# Patient Record
Sex: Female | Born: 1970
Health system: Southern US, Community
[De-identification: ages and names within clinical notes are randomized; demographics above are authoritative.]

---

## 1999-09-24 ENCOUNTER — Ambulatory Visit (HOSPITAL_COMMUNITY): Admission: RE | Admit: 1999-09-24 | Discharge: 1999-09-24 | Payer: Self-pay | Admitting: Internal Medicine

## 1999-10-22 ENCOUNTER — Encounter: Payer: Self-pay | Admitting: Internal Medicine

## 1999-10-22 ENCOUNTER — Encounter: Admission: RE | Admit: 1999-10-22 | Discharge: 1999-10-22 | Payer: Self-pay | Admitting: Internal Medicine

## 2000-05-11 ENCOUNTER — Other Ambulatory Visit: Admission: RE | Admit: 2000-05-11 | Discharge: 2000-05-11 | Payer: Self-pay | Admitting: Obstetrics and Gynecology

## 2001-01-31 ENCOUNTER — Observation Stay (HOSPITAL_COMMUNITY): Admission: AD | Admit: 2001-01-31 | Discharge: 2001-02-01 | Payer: Self-pay | Admitting: Obstetrics and Gynecology

## 2001-01-31 ENCOUNTER — Encounter: Payer: Self-pay | Admitting: Obstetrics and Gynecology

## 2001-04-05 ENCOUNTER — Inpatient Hospital Stay (HOSPITAL_COMMUNITY): Admission: AD | Admit: 2001-04-05 | Discharge: 2001-04-05 | Payer: Self-pay | Admitting: Obstetrics and Gynecology

## 2001-04-26 ENCOUNTER — Inpatient Hospital Stay (HOSPITAL_COMMUNITY): Admission: AD | Admit: 2001-04-26 | Discharge: 2001-04-28 | Payer: Self-pay | Admitting: Obstetrics and Gynecology

## 2001-06-06 ENCOUNTER — Other Ambulatory Visit: Admission: RE | Admit: 2001-06-06 | Discharge: 2001-06-06 | Payer: Self-pay | Admitting: Obstetrics and Gynecology

## 2002-06-06 ENCOUNTER — Other Ambulatory Visit: Admission: RE | Admit: 2002-06-06 | Discharge: 2002-06-06 | Payer: Self-pay | Admitting: Obstetrics and Gynecology

## 2003-07-09 ENCOUNTER — Other Ambulatory Visit: Admission: RE | Admit: 2003-07-09 | Discharge: 2003-07-09 | Payer: Self-pay | Admitting: Obstetrics and Gynecology

## 2004-09-14 ENCOUNTER — Other Ambulatory Visit: Admission: RE | Admit: 2004-09-14 | Discharge: 2004-09-14 | Payer: Self-pay | Admitting: Obstetrics and Gynecology

## 2005-05-24 ENCOUNTER — Inpatient Hospital Stay (HOSPITAL_COMMUNITY): Admission: AD | Admit: 2005-05-24 | Discharge: 2005-05-26 | Payer: Self-pay | Admitting: Obstetrics and Gynecology

## 2005-06-20 ENCOUNTER — Other Ambulatory Visit: Admission: RE | Admit: 2005-06-20 | Discharge: 2005-06-20 | Payer: Self-pay | Admitting: Obstetrics and Gynecology

## 2005-06-23 ENCOUNTER — Encounter: Admission: RE | Admit: 2005-06-23 | Discharge: 2005-06-23 | Payer: Self-pay | Admitting: Obstetrics and Gynecology

## 2005-06-23 HISTORY — PX: BREAST BIOPSY: SHX20

## 2012-08-27 ENCOUNTER — Ambulatory Visit (INDEPENDENT_AMBULATORY_CARE_PROVIDER_SITE_OTHER): Payer: BC Managed Care – PPO | Admitting: Emergency Medicine

## 2012-08-27 VITALS — BP 126/74 | HR 65 | Temp 98.5°F | Resp 17 | Ht 64.0 in | Wt 115.0 lb

## 2012-08-27 DIAGNOSIS — J349 Unspecified disorder of nose and nasal sinuses: Secondary | ICD-10-CM

## 2012-08-27 DIAGNOSIS — J329 Chronic sinusitis, unspecified: Secondary | ICD-10-CM

## 2012-08-27 DIAGNOSIS — J309 Allergic rhinitis, unspecified: Secondary | ICD-10-CM

## 2012-08-27 DIAGNOSIS — R51 Headache: Secondary | ICD-10-CM

## 2012-08-27 DIAGNOSIS — J00 Acute nasopharyngitis [common cold]: Secondary | ICD-10-CM

## 2012-08-27 DIAGNOSIS — R05 Cough: Secondary | ICD-10-CM

## 2012-08-27 DIAGNOSIS — R059 Cough, unspecified: Secondary | ICD-10-CM

## 2012-08-27 DIAGNOSIS — R519 Headache, unspecified: Secondary | ICD-10-CM

## 2012-08-27 LAB — POCT RAPID STREP A (OFFICE): Rapid Strep A Screen: NEGATIVE

## 2012-08-27 MED ORDER — FLUTICASONE PROPIONATE 50 MCG/ACT NA SUSP: 2.0000 | Freq: Every day | NASAL | Status: DC

## 2012-08-27 MED ORDER — AMOXICILLIN 875 MG PO TABS: 875.0000 mg | ORAL_TABLET | Freq: Two times a day (BID) | ORAL | Status: DC

## 2012-08-27 MED ORDER — BENZONATATE 100 MG PO CAPS: 100.0000 mg | ORAL_CAPSULE | Freq: Three times a day (TID) | ORAL | Status: DC | PRN

## 2012-08-27 NOTE — Progress Notes (Signed)
  Subjective:    Patient ID: Michele Edwards, female    DOB: 1971/03/07, 41 y.o.   MRN: [id number]  Sinusitis There has been no fever. Associated symptoms include congestion, coughing, headaches, sinus pressure and a sore throat.  Headache  Associated symptoms include coughing, sinus pressure and a sore throat.  41 year old female comes into day complaining of a sore throat and headache for 2 days hasn'tnt been around anyone sick Has had flu shot this year A little facial pain when bending over Some cough more at night HX of sinus infections Yellowish green mucus take zyrtec for sinus    Review of Systems  HENT: Positive for congestion, sore throat and sinus pressure.   Respiratory: Positive for cough.   Neurological: Positive for headaches.       Objective:   Physical Exam HEENT exam reveals significant nasal congestion. There is fluid behind the left TM. She's had a tonsillectomy there is mild redness of the posterior fat her neck was supple chest was clear to both auscultation and percussion  Results for orders placed in visit on 08/27/12  POCT RAPID STREP A (OFFICE)      Component Value Range   Rapid Strep A Screen Negative  Negative        Assessment & Plan:  History and physical are most consistent with a sinusitis. We'll treat with Flonase and amoxicillin gave her patient information regarding sinusitis

## 2012-08-27 NOTE — Patient Instructions (Addendum)

## 2012-08-28 ENCOUNTER — Other Ambulatory Visit: Payer: Self-pay | Admitting: Dermatology

## 2012-09-20 ENCOUNTER — Other Ambulatory Visit: Payer: Self-pay | Admitting: Dermatology

## 2013-09-04 ENCOUNTER — Other Ambulatory Visit: Payer: Self-pay | Admitting: Dermatology

## 2013-11-19 ENCOUNTER — Other Ambulatory Visit: Payer: Self-pay | Admitting: Obstetrics and Gynecology

## 2013-12-31 ENCOUNTER — Ambulatory Visit (INDEPENDENT_AMBULATORY_CARE_PROVIDER_SITE_OTHER): Payer: BC Managed Care – PPO | Admitting: Podiatry

## 2013-12-31 ENCOUNTER — Encounter: Payer: Self-pay | Admitting: Podiatry

## 2013-12-31 VITALS — BP 112/66 | HR 57 | Resp 12

## 2013-12-31 DIAGNOSIS — L03039 Cellulitis of unspecified toe: Secondary | ICD-10-CM

## 2013-12-31 DIAGNOSIS — M79609 Pain in unspecified limb: Secondary | ICD-10-CM

## 2013-12-31 NOTE — Progress Notes (Signed)
   Subjective:    Patient ID: Michele Edwards, female    DOB: 02/26/1971, 43 y.o.   MRN: [id number]  HPI  PT STATED LT FOOT GREAT TOENAIL IS BEEN HURTING FOR 1 WEEK. THE TOENAIL IS GETTING BETTER. THE TOE GET AGGRAVATED BY PRESSURE BUT TRIED NO TREATMENT.   Review of Systems  All other systems reviewed and are negative.       Objective:   Physical Exam: Pulses are palpable bilateral. She has mild erythema to the fibular border of the hallux left. Purple pain collar to the nails. No reproducible pain on palpation of the toe. I evaluated the fibular border of the toenail were she had trimmed the majority of the nail away from the margin. I was able to easily see the margin of the nail does not appear to be clinically infected at this point.        Assessment & Plan:  Assessment: Mild paronychia hallux left.  Plan: Followup with me on an as-needed basis. Remember to ask her our her trip to [place] was and how was the diving.

## 2014-03-20 ENCOUNTER — Other Ambulatory Visit: Payer: Self-pay | Admitting: Dermatology

## 2014-05-22 ENCOUNTER — Other Ambulatory Visit: Payer: Self-pay | Admitting: Obstetrics and Gynecology

## 2014-05-23 LAB — CYTOLOGY - PAP

## 2014-11-27 ENCOUNTER — Other Ambulatory Visit: Payer: Self-pay | Admitting: Obstetrics and Gynecology

## 2014-12-01 LAB — CYTOLOGY - PAP

## 2015-07-02 ENCOUNTER — Ambulatory Visit (INDEPENDENT_AMBULATORY_CARE_PROVIDER_SITE_OTHER): Payer: BLUE CROSS/BLUE SHIELD | Admitting: Physician Assistant

## 2015-07-02 VITALS — BP 108/60 | HR 62 | Temp 98.8°F | Resp 16 | Ht 64.0 in | Wt 126.0 lb

## 2015-07-02 DIAGNOSIS — R070 Pain in throat: Secondary | ICD-10-CM | POA: Diagnosis not present

## 2015-07-02 DIAGNOSIS — J029 Acute pharyngitis, unspecified: Secondary | ICD-10-CM | POA: Diagnosis not present

## 2015-07-02 LAB — POCT RAPID STREP A (OFFICE): RAPID STREP A SCREEN: NEGATIVE

## 2015-07-02 MED ORDER — MAGIC MOUTHWASH W/LIDOCAINE: 5.0000 mL | ORAL | Status: AC | PRN

## 2015-07-02 NOTE — Progress Notes (Signed)
[facility] [address] [place] [address] [phone]  Date:  07/02/2015   Name:  Michele Edwards   DOB:  [birth year]   MRN:  [id number]  PCP:  Willow Ora, MD    History of Present Illness:  Michele Edwards is a 44 y.o. female patient who presents to [facility] for chief complaint of throat pain for 5 days.  Day after considerable throat pain, she was seen at [facility], who did a rapid strep.  She was given tylenol.  Since then, the throat pain has stayed the same.  Within the last 2 days, developed mild nasal congestion and ear discomfort.  She had a tonsillectomy years ago.     There are no active problems to display for this patient.   History reviewed. No pertinent past medical history.  History reviewed. No pertinent past surgical history.  Social History  Substance Use Topics  . Smoking status: Never Smoker   . Smokeless tobacco: None  . Alcohol Use: 0.6 oz/week    1 Glasses of wine per week    History reviewed. No pertinent family history.  No Known Allergies  Medication list has been reviewed and updated.  Current Outpatient Prescriptions on File Prior to Visit  Medication Sig Dispense Refill  . cetirizine (ZYRTEC) 10 MG tablet Take 10 mg by mouth daily.    Marland Kitchen spironolactone (ALDACTONE) 50 MG tablet Take 50 mg by mouth daily.    . Multiple Vitamins-Minerals (MULTIVITAMIN WITH MINERALS) tablet Take 1 tablet by mouth daily.     No current facility-administered medications on file prior to visit.    ROS ROS otherwise unremarkable unless listed above.   Physical Examination: BP 108/60 mmHg  Pulse 62  Temp(Src) 98.8 F (37.1 C) (Oral)  Resp 16  Ht  (1.626 m)  Wt 126 lb (57.153 kg)  BMI 21.62 kg/m2  SpO2 99%  LMP 05/30/2015 Ideal Body Weight: Weight in (lb) to have BMI = 25: 145.3  Physical Exam  Constitutional: She is oriented to person, place, and time. She appears well-developed and well-nourished. No  distress.  HENT:  Head: Normocephalic and atraumatic.  Right Ear: External ear normal.  Left Ear: External ear normal.  Nose: Right sinus exhibits no maxillary sinus tenderness and no frontal sinus tenderness. Left sinus exhibits no maxillary sinus tenderness and no frontal sinus tenderness.  Mouth/Throat: Posterior oropharyngeal erythema (Beefy red at oropharynx.  There is no mucus present, revealing signs of post-nasal drip .  ) present. No oropharyngeal exudate or posterior oropharyngeal edema.  Eyes: Conjunctivae and EOM are normal. Pupils are equal, round, and reactive to light.  Cardiovascular: Normal rate.   Pulmonary/Chest: Effort normal. No respiratory distress.  Neurological: She is alert and oriented to person, place, and time.  Skin: She is not diaphoretic.  Psychiatric: She has a normal mood and affect. Her behavior is normal.     Assessment and Plan: 44 year old female is here today for chief complaint of pharyngitis.  This present negative.  I am placing a strep culture at this time.   I have advised her to use tylenol and cepacol otc.   Magic mouthwash given as well. Will contact for results.  If positive, will treat with amoxicillin  bid 10 days.   Acute pharyngitis, unspecified pharyngitis type - Plan: magic mouthwash w/lidocaine SOLN  Throat pain in adult - Plan: POCT rapid strep A, Culture, Group A Strep, magic mouthwash w/lidocaine SOLN  Trena Platt, PA-C [facility] 07/02/2015 9:25 AM

## 2015-07-02 NOTE — Patient Instructions (Signed)
At this time this, appears to be viral.  I am placing a strep culture at this time.  It should come back within the next 48-72 hours.  I will contact you with otherwise.   Please use tylenol.  Gargle with saline solution.     Pharyngitis Pharyngitis is redness, pain, and swelling (inflammation) of your pharynx.  CAUSES  Pharyngitis is usually caused by infection. Most of the time, these infections are from viruses (viral) and are part of a cold. However, sometimes pharyngitis is caused by bacteria (bacterial). Pharyngitis can also be caused by allergies. Viral pharyngitis may be spread from person to person by coughing, sneezing, and personal items or utensils (cups, forks, spoons, toothbrushes). Bacterial pharyngitis may be spread from person to person by more intimate contact, such as kissing.  SIGNS AND SYMPTOMS  Symptoms of pharyngitis include:   Sore throat.   Tiredness (fatigue).   Low-grade fever.   Headache.  Joint pain and muscle aches.  Skin rashes.  Swollen lymph nodes.  Plaque-like film on throat or tonsils (often seen with bacterial pharyngitis). DIAGNOSIS  Your health care provider will ask you questions about your illness and your symptoms. Your medical history, along with a physical exam, is often all that is needed to diagnose pharyngitis. Sometimes, a rapid strep test is done. Other lab tests may also be done, depending on the suspected cause.  TREATMENT  Viral pharyngitis will usually get better in 3-4 days without the use of medicine. Bacterial pharyngitis is treated with medicines that kill germs (antibiotics).  HOME CARE INSTRUCTIONS   Drink enough water and fluids to keep your urine clear or pale yellow.   Only take over-the-counter or prescription medicines as directed by your health care provider:   If you are prescribed antibiotics, make sure you finish them even if you start to feel better.   Do not take aspirin.   Get lots of rest.    Gargle with 8 oz of salt water ( tsp of salt per 1 qt of water) as often as every 1-2 hours to soothe your throat.   Throat lozenges (if you are not at risk for choking) or sprays may be used to soothe your throat. SEEK MEDICAL CARE IF:   You have large, tender lumps in your neck.  You have a rash.  You cough up green, yellow-brown, or bloody spit. SEEK IMMEDIATE MEDICAL CARE IF:   Your neck becomes stiff.  You drool or are unable to swallow liquids.  You vomit or are unable to keep medicines or liquids down.  You have severe pain that does not go away with the use of recommended medicines.  You have trouble breathing (not caused by a stuffy nose). MAKE SURE YOU:   Understand these instructions.  Will watch your condition.  Will get help right away if you are not doing well or get worse. Document Released: 09/19/2005 Document Revised: 07/10/2013 Document Reviewed: 05/27/2013 [facility] Patient Information 2015 [place], [place]. This information is not intended to replace advice given to you by your health care provider. Make sure you discuss any questions you have with your health care provider.

## 2015-07-03 LAB — CULTURE, GROUP A STREP: ORGANISM ID, BACTERIA: NORMAL

## 2015-07-06 ENCOUNTER — Telehealth: Payer: Self-pay

## 2015-07-06 NOTE — Telephone Encounter (Signed)
ERROR

## 2016-10-13 DIAGNOSIS — M79672 Pain in left foot: Secondary | ICD-10-CM | POA: Diagnosis not present

## 2016-11-01 DIAGNOSIS — C44319 Basal cell carcinoma of skin of other parts of face: Secondary | ICD-10-CM | POA: Diagnosis not present

## 2016-11-01 DIAGNOSIS — D2271 Melanocytic nevi of right lower limb, including hip: Secondary | ICD-10-CM | POA: Diagnosis not present

## 2016-11-01 DIAGNOSIS — D2262 Melanocytic nevi of left upper limb, including shoulder: Secondary | ICD-10-CM | POA: Diagnosis not present

## 2016-11-01 DIAGNOSIS — D225 Melanocytic nevi of trunk: Secondary | ICD-10-CM | POA: Diagnosis not present

## 2016-11-01 DIAGNOSIS — D2261 Melanocytic nevi of right upper limb, including shoulder: Secondary | ICD-10-CM | POA: Diagnosis not present

## 2016-11-01 DIAGNOSIS — D485 Neoplasm of uncertain behavior of skin: Secondary | ICD-10-CM | POA: Diagnosis not present

## 2016-12-14 DIAGNOSIS — Z0000 Encounter for general adult medical examination without abnormal findings: Secondary | ICD-10-CM | POA: Diagnosis not present

## 2016-12-15 DIAGNOSIS — C44319 Basal cell carcinoma of skin of other parts of face: Secondary | ICD-10-CM | POA: Diagnosis not present

## 2016-12-16 ENCOUNTER — Other Ambulatory Visit: Payer: Self-pay | Admitting: Family Medicine

## 2016-12-16 DIAGNOSIS — Z7689 Persons encountering health services in other specified circumstances: Secondary | ICD-10-CM | POA: Diagnosis not present

## 2016-12-16 DIAGNOSIS — Z0000 Encounter for general adult medical examination without abnormal findings: Secondary | ICD-10-CM | POA: Diagnosis not present

## 2016-12-16 DIAGNOSIS — Z01419 Encounter for gynecological examination (general) (routine) without abnormal findings: Secondary | ICD-10-CM | POA: Diagnosis not present

## 2016-12-16 DIAGNOSIS — Z1231 Encounter for screening mammogram for malignant neoplasm of breast: Secondary | ICD-10-CM

## 2016-12-19 DIAGNOSIS — H17823 Peripheral opacity of cornea, bilateral: Secondary | ICD-10-CM | POA: Diagnosis not present

## 2016-12-19 DIAGNOSIS — H04123 Dry eye syndrome of bilateral lacrimal glands: Secondary | ICD-10-CM | POA: Diagnosis not present

## 2017-01-11 ENCOUNTER — Ambulatory Visit
Admission: RE | Admit: 2017-01-11 | Discharge: 2017-01-11 | Disposition: A | Discharge status: Home / Self Care | Payer: BLUE CROSS/BLUE SHIELD | Source: Ambulatory Visit | Attending: Family Medicine | Admitting: Family Medicine

## 2017-01-11 DIAGNOSIS — Z1231 Encounter for screening mammogram for malignant neoplasm of breast: Secondary | ICD-10-CM

## 2017-10-11 DIAGNOSIS — Z23 Encounter for immunization: Secondary | ICD-10-CM | POA: Diagnosis not present

## 2017-10-11 DIAGNOSIS — G4709 Other insomnia: Secondary | ICD-10-CM | POA: Diagnosis not present

## 2017-10-11 DIAGNOSIS — Z6823 Body mass index (BMI) 23.0-23.9, adult: Secondary | ICD-10-CM | POA: Diagnosis not present

## 2017-10-12 DIAGNOSIS — H6981 Other specified disorders of Eustachian tube, right ear: Secondary | ICD-10-CM | POA: Diagnosis not present

## 2017-10-31 DIAGNOSIS — Z79899 Other long term (current) drug therapy: Secondary | ICD-10-CM | POA: Diagnosis not present

## 2017-10-31 DIAGNOSIS — L700 Acne vulgaris: Secondary | ICD-10-CM | POA: Diagnosis not present

## 2017-10-31 DIAGNOSIS — D2261 Melanocytic nevi of right upper limb, including shoulder: Secondary | ICD-10-CM | POA: Diagnosis not present

## 2017-10-31 DIAGNOSIS — D225 Melanocytic nevi of trunk: Secondary | ICD-10-CM | POA: Diagnosis not present

## 2017-12-01 DIAGNOSIS — M79671 Pain in right foot: Secondary | ICD-10-CM | POA: Diagnosis not present

## 2017-12-20 ENCOUNTER — Other Ambulatory Visit: Payer: Self-pay | Admitting: Family Medicine

## 2017-12-20 DIAGNOSIS — Z114 Encounter for screening for human immunodeficiency virus [HIV]: Secondary | ICD-10-CM | POA: Diagnosis not present

## 2017-12-20 DIAGNOSIS — Z0000 Encounter for general adult medical examination without abnormal findings: Secondary | ICD-10-CM | POA: Diagnosis not present

## 2017-12-20 DIAGNOSIS — Z13220 Encounter for screening for lipoid disorders: Secondary | ICD-10-CM | POA: Diagnosis not present

## 2017-12-20 DIAGNOSIS — Z1329 Encounter for screening for other suspected endocrine disorder: Secondary | ICD-10-CM | POA: Diagnosis not present

## 2017-12-20 DIAGNOSIS — Z1231 Encounter for screening mammogram for malignant neoplasm of breast: Secondary | ICD-10-CM

## 2017-12-22 DIAGNOSIS — N760 Acute vaginitis: Secondary | ICD-10-CM | POA: Diagnosis not present

## 2017-12-22 DIAGNOSIS — Z6822 Body mass index (BMI) 22.0-22.9, adult: Secondary | ICD-10-CM | POA: Diagnosis not present

## 2017-12-22 DIAGNOSIS — Z118 Encounter for screening for other infectious and parasitic diseases: Secondary | ICD-10-CM | POA: Diagnosis not present

## 2017-12-22 DIAGNOSIS — Z01419 Encounter for gynecological examination (general) (routine) without abnormal findings: Secondary | ICD-10-CM | POA: Diagnosis not present

## 2017-12-22 DIAGNOSIS — Z0000 Encounter for general adult medical examination without abnormal findings: Secondary | ICD-10-CM | POA: Diagnosis not present

## 2018-01-01 DIAGNOSIS — H04123 Dry eye syndrome of bilateral lacrimal glands: Secondary | ICD-10-CM | POA: Diagnosis not present

## 2018-01-01 DIAGNOSIS — H17822 Peripheral opacity of cornea, left eye: Secondary | ICD-10-CM | POA: Diagnosis not present

## 2018-01-16 ENCOUNTER — Ambulatory Visit
Admission: RE | Admit: 2018-01-16 | Discharge: 2018-01-16 | Disposition: A | Discharge status: Home / Self Care | Payer: BLUE CROSS/BLUE SHIELD | Source: Ambulatory Visit | Attending: Family Medicine | Admitting: Family Medicine

## 2018-01-16 DIAGNOSIS — Z1231 Encounter for screening mammogram for malignant neoplasm of breast: Secondary | ICD-10-CM

## 2018-01-29 DIAGNOSIS — H17822 Peripheral opacity of cornea, left eye: Secondary | ICD-10-CM | POA: Diagnosis not present

## 2018-01-29 DIAGNOSIS — H43811 Vitreous degeneration, right eye: Secondary | ICD-10-CM | POA: Diagnosis not present

## 2018-01-29 DIAGNOSIS — H04123 Dry eye syndrome of bilateral lacrimal glands: Secondary | ICD-10-CM | POA: Diagnosis not present

## 2018-02-09 ENCOUNTER — Encounter (INDEPENDENT_AMBULATORY_CARE_PROVIDER_SITE_OTHER): Payer: BLUE CROSS/BLUE SHIELD | Admitting: Ophthalmology

## 2018-02-09 DIAGNOSIS — H5213 Myopia, bilateral: Secondary | ICD-10-CM

## 2018-02-09 DIAGNOSIS — H43813 Vitreous degeneration, bilateral: Secondary | ICD-10-CM | POA: Diagnosis not present

## 2018-02-09 DIAGNOSIS — H33301 Unspecified retinal break, right eye: Secondary | ICD-10-CM

## 2018-02-20 ENCOUNTER — Encounter (INDEPENDENT_AMBULATORY_CARE_PROVIDER_SITE_OTHER): Payer: BLUE CROSS/BLUE SHIELD | Admitting: Ophthalmology

## 2018-02-20 DIAGNOSIS — H33301 Unspecified retinal break, right eye: Secondary | ICD-10-CM

## 2018-06-29 ENCOUNTER — Encounter (INDEPENDENT_AMBULATORY_CARE_PROVIDER_SITE_OTHER): Payer: BLUE CROSS/BLUE SHIELD | Admitting: Ophthalmology

## 2018-07-05 DIAGNOSIS — Z23 Encounter for immunization: Secondary | ICD-10-CM | POA: Diagnosis not present

## 2018-07-16 ENCOUNTER — Encounter (INDEPENDENT_AMBULATORY_CARE_PROVIDER_SITE_OTHER): Payer: BLUE CROSS/BLUE SHIELD | Admitting: Ophthalmology

## 2018-07-16 DIAGNOSIS — H33301 Unspecified retinal break, right eye: Secondary | ICD-10-CM

## 2018-07-16 DIAGNOSIS — H2513 Age-related nuclear cataract, bilateral: Secondary | ICD-10-CM

## 2018-07-16 DIAGNOSIS — H43813 Vitreous degeneration, bilateral: Secondary | ICD-10-CM | POA: Diagnosis not present

## 2018-11-15 DIAGNOSIS — D225 Melanocytic nevi of trunk: Secondary | ICD-10-CM | POA: Diagnosis not present

## 2018-11-15 DIAGNOSIS — D2271 Melanocytic nevi of right lower limb, including hip: Secondary | ICD-10-CM | POA: Diagnosis not present

## 2018-11-15 DIAGNOSIS — Z23 Encounter for immunization: Secondary | ICD-10-CM | POA: Diagnosis not present

## 2018-11-15 DIAGNOSIS — D2262 Melanocytic nevi of left upper limb, including shoulder: Secondary | ICD-10-CM | POA: Diagnosis not present

## 2018-11-15 DIAGNOSIS — D2261 Melanocytic nevi of right upper limb, including shoulder: Secondary | ICD-10-CM | POA: Diagnosis not present

## 2018-11-20 ENCOUNTER — Ambulatory Visit: Payer: Self-pay | Admitting: Physician Assistant

## 2018-11-20 ENCOUNTER — Encounter: Payer: Self-pay | Admitting: Physician Assistant

## 2018-11-20 VITALS — BP 90/72 | HR 73 | Temp 98.8°F | Resp 14 | Wt 120.4 lb

## 2018-11-20 DIAGNOSIS — J111 Influenza due to unidentified influenza virus with other respiratory manifestations: Secondary | ICD-10-CM

## 2018-11-20 DIAGNOSIS — J029 Acute pharyngitis, unspecified: Secondary | ICD-10-CM

## 2018-11-20 DIAGNOSIS — R69 Illness, unspecified: Secondary | ICD-10-CM

## 2018-11-20 LAB — POCT RAPID STREP A (OFFICE): Rapid Strep A Screen: NEGATIVE

## 2018-11-20 MED ORDER — BENZONATATE 100 MG PO CAPS: 100.0000 mg | ORAL_CAPSULE | Freq: Three times a day (TID) | ORAL | 0 refills | Status: AC | PRN

## 2018-11-20 MED ORDER — PSEUDOEPH-BROMPHEN-DM 30-2-10 MG/5ML PO SYRP: 5.0000 mL | ORAL_SOLUTION | Freq: Four times a day (QID) | ORAL | 0 refills | Status: AC | PRN

## 2018-11-20 MED ORDER — FLUTICASONE PROPIONATE 50 MCG/ACT NA SUSP: 2.0000 | Freq: Every day | NASAL | 0 refills | Status: AC

## 2018-11-20 MED ORDER — SALINE SPRAY 0.65 % NA SOLN: 1.0000 | NASAL | 0 refills | Status: AC | PRN

## 2018-11-20 NOTE — Progress Notes (Signed)
MRN: [id number] DOB: [birth year]  Subjective:   Michele Edwards is a 48 y.o. female presenting for chief complaint of Sore Throat (x4 days (advil)) .  Reports 4 day hx of sudden onset illness. Woke up 4 days ago feeling like she "got hit by a bus."  Sx include body aches, subjective fever, chills, nasal congestion, sore throat, sinus pressure, and dry cough.   Has tried advil and water with no full relief.  Denies sinus pain, inability to swallow, productive cough, wheezing, shortness of breath, chest tightness and chest pain, nausea, vomiting, abdominal pain and diarrhea. No known sick contact exposure. Has history of seasonal allergies. Denies hx of asthma, DM, HTN, and heart disease. Does use spironolactone 50mg  daily for acne. BP always runs on the lower side of normal. Denies lightheadedness, dizziness, fatigue, and visual disturbance.  Patient has had flu shot this season. Denies smoking. PSH of tonsillectomy.  Denies any other aggravating or relieving factors, no other questions or concerns.  Review of Systems  Constitutional: Negative for diaphoresis.  Respiratory: Negative for hemoptysis.   Musculoskeletal: Negative for neck pain.  Skin: Negative for rash.  Neurological: Negative for headaches.    Michele Edwards has a current medication list which includes the following prescription(s): benzonatate, brompheniramine-pseudoephedrine-dm, cetirizine, fluticasone, multivitamin with minerals, sodium chloride, and spironolactone. Also has No Known Allergies.  Michele Edwards  has no past medical history on file. Also  has a past surgical history that includes Breast biopsy (06/23/2005).   Objective:   Vitals: BP 90/72   Pulse 73   Temp 98.8 F (37.1 C)   Resp 14   Wt 120 lb 6.4 oz (54.6 kg)   SpO2 99%   BMI 20.67 kg/m   Physical Exam Vitals signs reviewed.  Constitutional:      General: She is not in acute distress.    Appearance: She is well-developed. She is not ill-appearing or  toxic-appearing.  HENT:     Head: Normocephalic and atraumatic.     Right Ear: Ear canal and external ear normal. Tympanic membrane is retracted. Tympanic membrane is not erythematous or bulging.     Left Ear: Ear canal and external ear normal. Tympanic membrane is retracted. Tympanic membrane is not erythematous or bulging.     Nose: Congestion and rhinorrhea present. Rhinorrhea is clear.     Right Sinus: No maxillary sinus tenderness or frontal sinus tenderness.     Left Sinus: No maxillary sinus tenderness or frontal sinus tenderness.     Mouth/Throat:     Lips: Pink.     Mouth: Mucous membranes are moist.     Pharynx: Uvula midline. Posterior oropharyngeal erythema present. No pharyngeal swelling, oropharyngeal exudate or uvula swelling.     Tonsils: Swelling: 0 on the right. 0 on the left.     Comments: S/p tonsillectomy Eyes:     Conjunctiva/sclera: Conjunctivae normal.  Neck:     Musculoskeletal: Normal range of motion.  Cardiovascular:     Rate and Rhythm: Normal rate and regular rhythm.     Heart sounds: Normal heart sounds.  Pulmonary:     Effort: Pulmonary effort is normal.     Breath sounds: Normal breath sounds. No decreased breath sounds, wheezing, rhonchi or rales.  Lymphadenopathy:     Head:     Right side of head: No submental, submandibular, tonsillar, preauricular, posterior auricular or occipital adenopathy.     Left side of head: No submental, submandibular, tonsillar, preauricular, posterior auricular or occipital adenopathy.  Cervical: No cervical adenopathy.     Upper Body:     Right upper body: No supraclavicular adenopathy.     Left upper body: No supraclavicular adenopathy.  Skin:    General: Skin is warm and dry.  Neurological:     Mental Status: She is alert.     Results for orders placed or performed in visit on 11/20/18 (from the past 24 hour(s))  POCT rapid strep A     Status: None   Collection Time: 11/20/18  8:28 AM  Result Value Ref  Range   Rapid Strep A Screen Negative Negative    BP Readings from Last 3 Encounters:  11/20/18 90/72  07/02/15 108/60  12/31/13 112/66    Assessment and Plan :  1. Influenza-like illness Patient is overall well-appearing, no acute distress. VSS. BP is at lower end of normal, she is asx. Did recommend increasing fluids and salty foods and f/u with dermatologist to discuss acne medication. Encouraged to check bp at home. Goal is >90/60, encouraged to contact PCP or seek care at ED if she develops any signs of hypotension (I.e., dizziness, lightheadedness, fatigue, visual disturbance, nausea, etc) .  POC testing negative for strep. Hx consistent with flu like illness vs other viral URI. She is outside treatment window and is otherwise healthy 48 yo female. Would recommend symptomatic tx at this time. Advised to follow upwith family doctororlocal urgent care if no improvement in symptoms after5-7days. Seek care sooner at local urgent care or ED if symptoms worsen/develop new concerning symptoms. Patient voices understanding. - brompheniramine-pseudoephedrine-DM 30-2-10 MG/5ML syrup; Take 5 mLs by mouth 4 (four) times daily as needed.  Dispense: 120 mL; Refill: 0 - fluticasone (FLONASE) 50 MCG/ACT nasal spray; Place 2 sprays into both nostrils daily.  Dispense: 16 g; Refill: 0 - sodium chloride (OCEAN) 0.65 % SOLN nasal spray; Place 1 spray into both nostrils as needed.  Dispense: 60 mL; Refill: 0 - benzonatate (TESSALON) 100 MG capsule; Take 1-2 capsules (100-200 mg total) by mouth 3 (three) times daily as needed for cough.  Dispense: 40 capsule; Refill: 0  2. Sore throat - POCT rapid strep A  Benjiman Core, PA-C  [facility] 11/20/2018 9:41 AM

## 2018-11-20 NOTE — Patient Instructions (Addendum)
This is consistent with flu like illness.  Please stay out of work until you are no longer contagious (I.e, 48 hours without any symptoms).  Rest, eat light meals, and stay hydrated.   Start bromfed syrup and tessalon perles during the day for cough and congestion.   May use over the counter nyquil at night.   Use flonase in the morning and nasal saline at night.   Use over the counter ibuprofen or tylenol throughout the day as prescribed for discomfort.    Two major complications after the flu are pneumonia and sinus infections. Please be aware of this and if you are not any better in 7-10 days or you develop worsening cough or sinus pressure, seek care at local urgent care or the ED. Continue to wash your hands and wear a mask daily especially around other people.   Influenza, Adult Influenza is also called "the flu." It is an infection in the lungs, nose, and throat (respiratory tract). It is caused by a virus. The flu causes symptoms that are similar to symptoms of a cold. It also causes a high fever and body aches. The flu spreads easily from person to person (is contagious). Getting a flu shot (influenza vaccination) every year is the best way to prevent the flu. What are the causes? This condition is caused by the influenza virus. You can get the virus by:  Breathing in droplets that are in the air from the cough or sneeze of a person who has the virus.  Touching something that has the virus on it (is contaminated) and then touching your mouth, nose, or eyes. What increases the risk? Certain things may make you more likely to get the flu. These include:  Not washing your hands often.  Having close contact with many people during cold and flu season.  Touching your mouth, eyes, or nose without first washing your hands.  Not getting a flu shot every year. You may have a higher risk for the flu, along with serious problems such as a lung infection (pneumonia), if  you:  Are older than 65.  Are pregnant.  Have a weakened disease-fighting system (immune system) because of a disease or taking certain medicines.  Have a long-term (chronic) illness, such as: ? Heart, kidney, or lung disease. ? Diabetes. ? Asthma.  Have a liver disorder.  Are very overweight (morbidly obese).  Have anemia. This is a condition that affects your red blood cells. What are the signs or symptoms? Symptoms usually begin suddenly and last 4-14 days. They may include:  Fever and chills.  Headaches, body aches, or muscle aches.  Sore throat.  Cough.  Runny or stuffy (congested) nose.  Chest discomfort.  Not wanting to eat as much as normal (poor appetite).  Weakness or feeling tired (fatigue).  Dizziness.  Feeling sick to your stomach (nauseous) or throwing up (vomiting). How is this treated? If the flu is found early, you can be treated with medicine that can help reduce how bad the illness is and how long it lasts (antiviral medicine). This may be given by mouth (orally) or through an IV tube. Taking care of yourself at home can help your symptoms get better. Your doctor may suggest:  Taking over-the-counter medicines.  Drinking plenty of fluids. The flu often goes away on its own. If you have very bad symptoms or other problems, you may be treated in a hospital. Follow these instructions at home:     Activity  Rest as  needed. Get plenty of sleep.  Stay home from work or school as told by your doctor. ? Do not leave home until you do not have a fever for 24 hours without taking medicine. ? Leave home only to visit your doctor. Eating and drinking  Take an ORS (oral rehydration solution). This is a drink that is sold at pharmacies and stores.  Drink enough fluid to keep your pee (urine) pale yellow.  Drink clear fluids in small amounts as you are able. Clear fluids include: ? Water. ? Ice chips. ? Fruit juice that has water added (diluted  fruit juice). ? Low-calorie sports drinks.  Eat bland, easy-to-digest foods in small amounts as you are able. These foods include: ? Bananas. ? Applesauce. ? Rice. ? Lean meats. ? Toast. ? Crackers.  Do not eat or drink: ? Fluids that have a lot of sugar or caffeine. ? Alcohol. ? Spicy or fatty foods. General instructions  Take over-the-counter and prescription medicines only as told by your doctor.  Use a cool mist humidifier to add moisture to the air in your home. This can make it easier for you to breathe.  Cover your mouth and nose when you cough or sneeze.  Wash your hands with soap and water often, especially after you cough or sneeze. If you cannot use soap and water, use alcohol-based hand sanitizer.  Keep all follow-up visits as told by your doctor. This is important. How is this prevented?   Get a flu shot every year. You may get the flu shot in late summer, fall, or winter. Ask your doctor when you should get your flu shot.  Avoid contact with people who are sick during fall and winter (cold and flu season). Contact a doctor if:  You get new symptoms.  You have: ? Chest pain. ? Watery poop (diarrhea). ? A fever.  Your cough gets worse.  You start to have more mucus.  You feel sick to your stomach.  You throw up. Get help right away if you:  Have shortness of breath.  Have trouble breathing.  Have skin or nails that turn a bluish color.  Have very bad pain or stiffness in your neck.  Get a sudden headache.  Get sudden pain in your face or ear.  Cannot eat or drink without throwing up. Summary  Influenza ("the flu") is an infection in the lungs, nose, and throat. It is caused by a virus.  Take over-the-counter and prescription medicines only as told by your doctor.  Getting a flu shot every year is the best way to avoid getting the flu. This information is not intended to replace advice given to you by your health care provider. Make  sure you discuss any questions you have with your health care provider. Document Released: 06/28/2008 Document Revised: 03/07/2018 Document Reviewed: 03/07/2018 Elsevier Interactive Patient Education  2019 [employer].

## 2018-11-22 ENCOUNTER — Telehealth: Payer: Self-pay

## 2018-11-22 NOTE — Telephone Encounter (Signed)
Patient did not answer the phone.

## 2019-04-17 DIAGNOSIS — Z03818 Encounter for observation for suspected exposure to other biological agents ruled out: Secondary | ICD-10-CM | POA: Diagnosis not present

## 2019-04-18 DIAGNOSIS — U071 COVID-19: Secondary | ICD-10-CM | POA: Diagnosis not present

## 2019-05-14 ENCOUNTER — Other Ambulatory Visit: Payer: Self-pay | Admitting: Family Medicine

## 2019-05-14 DIAGNOSIS — Z1231 Encounter for screening mammogram for malignant neoplasm of breast: Secondary | ICD-10-CM

## 2019-05-15 DIAGNOSIS — F902 Attention-deficit hyperactivity disorder, combined type: Secondary | ICD-10-CM | POA: Diagnosis not present

## 2019-05-15 DIAGNOSIS — F952 Tourette's disorder: Secondary | ICD-10-CM | POA: Diagnosis not present

## 2019-05-15 DIAGNOSIS — F422 Mixed obsessional thoughts and acts: Secondary | ICD-10-CM | POA: Diagnosis not present

## 2019-05-16 DIAGNOSIS — Z20828 Contact with and (suspected) exposure to other viral communicable diseases: Secondary | ICD-10-CM | POA: Diagnosis not present

## 2019-07-01 ENCOUNTER — Ambulatory Visit
Admission: RE | Admit: 2019-07-01 | Discharge: 2019-07-01 | Disposition: A | Discharge status: Home / Self Care | Payer: BC Managed Care – PPO | Source: Ambulatory Visit | Attending: Family Medicine | Admitting: Family Medicine

## 2019-07-01 ENCOUNTER — Other Ambulatory Visit: Payer: Self-pay

## 2019-07-01 DIAGNOSIS — Z1231 Encounter for screening mammogram for malignant neoplasm of breast: Secondary | ICD-10-CM | POA: Diagnosis not present

## 2019-07-29 DIAGNOSIS — Z20828 Contact with and (suspected) exposure to other viral communicable diseases: Secondary | ICD-10-CM | POA: Diagnosis not present

## 2019-08-23 ENCOUNTER — Encounter (INDEPENDENT_AMBULATORY_CARE_PROVIDER_SITE_OTHER): Payer: BC Managed Care – PPO | Admitting: Ophthalmology

## 2019-08-23 DIAGNOSIS — H43813 Vitreous degeneration, bilateral: Secondary | ICD-10-CM | POA: Diagnosis not present

## 2019-08-23 DIAGNOSIS — H33301 Unspecified retinal break, right eye: Secondary | ICD-10-CM | POA: Diagnosis not present

## 2019-08-23 DIAGNOSIS — H5213 Myopia, bilateral: Secondary | ICD-10-CM | POA: Diagnosis not present

## 2019-08-23 DIAGNOSIS — H538 Other visual disturbances: Secondary | ICD-10-CM | POA: Diagnosis not present

## 2019-09-30 DIAGNOSIS — H17822 Peripheral opacity of cornea, left eye: Secondary | ICD-10-CM | POA: Diagnosis not present

## 2019-09-30 DIAGNOSIS — H43811 Vitreous degeneration, right eye: Secondary | ICD-10-CM | POA: Diagnosis not present

## 2019-09-30 DIAGNOSIS — H04123 Dry eye syndrome of bilateral lacrimal glands: Secondary | ICD-10-CM | POA: Diagnosis not present

## 2019-11-13 DIAGNOSIS — Z79899 Other long term (current) drug therapy: Secondary | ICD-10-CM | POA: Diagnosis not present

## 2019-11-13 DIAGNOSIS — L700 Acne vulgaris: Secondary | ICD-10-CM | POA: Diagnosis not present

## 2019-11-13 DIAGNOSIS — Z23 Encounter for immunization: Secondary | ICD-10-CM | POA: Diagnosis not present

## 2019-11-13 DIAGNOSIS — L578 Other skin changes due to chronic exposure to nonionizing radiation: Secondary | ICD-10-CM | POA: Diagnosis not present

## 2019-11-13 DIAGNOSIS — D225 Melanocytic nevi of trunk: Secondary | ICD-10-CM | POA: Diagnosis not present

## 2019-12-02 DIAGNOSIS — M25521 Pain in right elbow: Secondary | ICD-10-CM | POA: Diagnosis not present

## 2019-12-06 ENCOUNTER — Other Ambulatory Visit: Payer: Self-pay

## 2019-12-06 ENCOUNTER — Ambulatory Visit: Payer: BC Managed Care – PPO | Attending: Internal Medicine

## 2019-12-06 DIAGNOSIS — Z23 Encounter for immunization: Secondary | ICD-10-CM | POA: Insufficient documentation

## 2019-12-06 NOTE — Progress Notes (Signed)
   Covid-19 Vaccination Clinic  Name:  Michele Edwards    MRN: [id number] DOB: 1971/02/12  12/06/2019  Michele Edwards was observed post Covid-19 immunization for 15 minutes without incident. She was provided with Vaccine Information Sheet and instruction to access the V-Safe system.   Michele Edwards was instructed to call 911 with any severe reactions post vaccine: Marland Kitchen Difficulty breathing  . Swelling of face and throat  . A fast heartbeat  . A bad rash all over body  . Dizziness and weakness

## 2019-12-11 DIAGNOSIS — M25521 Pain in right elbow: Secondary | ICD-10-CM | POA: Diagnosis not present

## 2020-01-06 DIAGNOSIS — Z0000 Encounter for general adult medical examination without abnormal findings: Secondary | ICD-10-CM | POA: Diagnosis not present

## 2020-01-06 DIAGNOSIS — Z13220 Encounter for screening for lipoid disorders: Secondary | ICD-10-CM | POA: Diagnosis not present

## 2020-01-06 DIAGNOSIS — Z1159 Encounter for screening for other viral diseases: Secondary | ICD-10-CM | POA: Diagnosis not present

## 2020-01-07 ENCOUNTER — Ambulatory Visit: Payer: BC Managed Care – PPO | Attending: Internal Medicine

## 2020-01-07 DIAGNOSIS — Z23 Encounter for immunization: Secondary | ICD-10-CM

## 2020-01-07 NOTE — Progress Notes (Signed)
   Covid-19 Vaccination Clinic  Name:  KENIYAH GELINAS    MRN: [id number] DOB: 30-Apr-1971  01/07/2020  Ms. Capshaw was observed post Covid-19 immunization for 15 minutes without incident. She was provided with Vaccine Information Sheet and instruction to access the V-Safe system.   Ms. Slight was instructed to call 911 with any severe reactions post vaccine: Marland Kitchen Difficulty breathing  . Swelling of face and throat  . A fast heartbeat  . A bad rash all over body  . Dizziness and weakness   Immunizations Administered    Name Date Dose VIS Date Route   Pfizer COVID-19 Vaccine 01/07/2020  1:53 PM 0.3 mL 09/13/2019 Intramuscular   Manufacturer: [employer], [employer]   Lot: [id number]   NDC: [address]-3

## 2020-02-08 DIAGNOSIS — Z20828 Contact with and (suspected) exposure to other viral communicable diseases: Secondary | ICD-10-CM | POA: Diagnosis not present

## 2020-02-08 DIAGNOSIS — Z03818 Encounter for observation for suspected exposure to other biological agents ruled out: Secondary | ICD-10-CM | POA: Diagnosis not present

## 2020-03-31 DIAGNOSIS — F902 Attention-deficit hyperactivity disorder, combined type: Secondary | ICD-10-CM | POA: Diagnosis not present

## 2020-03-31 DIAGNOSIS — F952 Tourette's disorder: Secondary | ICD-10-CM | POA: Diagnosis not present

## 2020-03-31 DIAGNOSIS — F422 Mixed obsessional thoughts and acts: Secondary | ICD-10-CM | POA: Diagnosis not present

## 2020-04-14 DIAGNOSIS — J0110 Acute frontal sinusitis, unspecified: Secondary | ICD-10-CM | POA: Diagnosis not present

## 2020-04-14 DIAGNOSIS — F324 Major depressive disorder, single episode, in partial remission: Secondary | ICD-10-CM | POA: Diagnosis not present

## 2020-05-13 DIAGNOSIS — F422 Mixed obsessional thoughts and acts: Secondary | ICD-10-CM | POA: Diagnosis not present

## 2020-05-13 DIAGNOSIS — F952 Tourette's disorder: Secondary | ICD-10-CM | POA: Diagnosis not present

## 2020-05-13 DIAGNOSIS — F902 Attention-deficit hyperactivity disorder, combined type: Secondary | ICD-10-CM | POA: Diagnosis not present

## 2020-05-29 DIAGNOSIS — F902 Attention-deficit hyperactivity disorder, combined type: Secondary | ICD-10-CM | POA: Diagnosis not present

## 2020-05-29 DIAGNOSIS — F422 Mixed obsessional thoughts and acts: Secondary | ICD-10-CM | POA: Diagnosis not present

## 2020-05-29 DIAGNOSIS — F952 Tourette's disorder: Secondary | ICD-10-CM | POA: Diagnosis not present

## 2020-07-08 DIAGNOSIS — F324 Major depressive disorder, single episode, in partial remission: Secondary | ICD-10-CM | POA: Diagnosis not present

## 2020-07-08 DIAGNOSIS — Z23 Encounter for immunization: Secondary | ICD-10-CM | POA: Diagnosis not present

## 2020-07-13 ENCOUNTER — Other Ambulatory Visit: Payer: Self-pay | Admitting: Family Medicine

## 2020-07-13 DIAGNOSIS — Z1231 Encounter for screening mammogram for malignant neoplasm of breast: Secondary | ICD-10-CM

## 2020-08-05 ENCOUNTER — Ambulatory Visit
Admission: RE | Admit: 2020-08-05 | Discharge: 2020-08-05 | Disposition: A | Discharge status: Home / Self Care | Payer: BC Managed Care – PPO | Source: Ambulatory Visit | Attending: Family Medicine | Admitting: Family Medicine

## 2020-08-05 ENCOUNTER — Other Ambulatory Visit: Payer: Self-pay

## 2020-08-05 DIAGNOSIS — Z1231 Encounter for screening mammogram for malignant neoplasm of breast: Secondary | ICD-10-CM

## 2020-09-16 DIAGNOSIS — F422 Mixed obsessional thoughts and acts: Secondary | ICD-10-CM | POA: Diagnosis not present

## 2020-09-16 DIAGNOSIS — F902 Attention-deficit hyperactivity disorder, combined type: Secondary | ICD-10-CM | POA: Diagnosis not present

## 2020-09-16 DIAGNOSIS — F952 Tourette's disorder: Secondary | ICD-10-CM | POA: Diagnosis not present

## 2020-09-16 DIAGNOSIS — G901 Familial dysautonomia [Riley-Day]: Secondary | ICD-10-CM | POA: Diagnosis not present

## 2020-10-12 DIAGNOSIS — H43811 Vitreous degeneration, right eye: Secondary | ICD-10-CM | POA: Diagnosis not present

## 2020-10-12 DIAGNOSIS — H17822 Peripheral opacity of cornea, left eye: Secondary | ICD-10-CM | POA: Diagnosis not present

## 2020-10-12 DIAGNOSIS — H04123 Dry eye syndrome of bilateral lacrimal glands: Secondary | ICD-10-CM | POA: Diagnosis not present

## 2020-11-06 DIAGNOSIS — H93293 Other abnormal auditory perceptions, bilateral: Secondary | ICD-10-CM | POA: Diagnosis not present

## 2020-11-06 DIAGNOSIS — H6982 Other specified disorders of Eustachian tube, left ear: Secondary | ICD-10-CM | POA: Diagnosis not present

## 2021-02-01 DIAGNOSIS — F422 Mixed obsessional thoughts and acts: Secondary | ICD-10-CM | POA: Diagnosis not present

## 2021-02-01 DIAGNOSIS — F952 Tourette's disorder: Secondary | ICD-10-CM | POA: Diagnosis not present

## 2021-02-01 DIAGNOSIS — F902 Attention-deficit hyperactivity disorder, combined type: Secondary | ICD-10-CM | POA: Diagnosis not present

## 2021-02-05 DIAGNOSIS — D225 Melanocytic nevi of trunk: Secondary | ICD-10-CM | POA: Diagnosis not present

## 2021-02-05 DIAGNOSIS — L573 Poikiloderma of Civatte: Secondary | ICD-10-CM | POA: Diagnosis not present

## 2021-02-05 DIAGNOSIS — L578 Other skin changes due to chronic exposure to nonionizing radiation: Secondary | ICD-10-CM | POA: Diagnosis not present

## 2021-02-05 DIAGNOSIS — L559 Sunburn, unspecified: Secondary | ICD-10-CM | POA: Diagnosis not present

## 2021-02-16 DIAGNOSIS — J029 Acute pharyngitis, unspecified: Secondary | ICD-10-CM | POA: Diagnosis not present

## 2021-03-17 DIAGNOSIS — E7800 Pure hypercholesterolemia, unspecified: Secondary | ICD-10-CM | POA: Diagnosis not present

## 2021-03-18 ENCOUNTER — Other Ambulatory Visit (HOSPITAL_COMMUNITY)
Admission: RE | Admit: 2021-03-18 | Discharge: 2021-03-18 | Disposition: A | Discharge status: Home / Self Care | Payer: BC Managed Care – PPO | Source: Ambulatory Visit | Attending: Family Medicine | Admitting: Family Medicine

## 2021-03-18 ENCOUNTER — Other Ambulatory Visit: Payer: Self-pay | Admitting: Family Medicine

## 2021-03-18 DIAGNOSIS — Z01411 Encounter for gynecological examination (general) (routine) with abnormal findings: Secondary | ICD-10-CM | POA: Insufficient documentation

## 2021-03-18 DIAGNOSIS — Z0000 Encounter for general adult medical examination without abnormal findings: Secondary | ICD-10-CM | POA: Diagnosis not present

## 2021-03-19 ENCOUNTER — Other Ambulatory Visit (HOSPITAL_COMMUNITY): Payer: Self-pay | Admitting: Family Medicine

## 2021-03-22 LAB — CYTOLOGY - PAP
Comment: NEGATIVE
Diagnosis: NEGATIVE
High risk HPV: NEGATIVE

## 2021-03-24 ENCOUNTER — Ambulatory Visit (HOSPITAL_COMMUNITY)
Admission: RE | Admit: 2021-03-24 | Discharge: 2021-03-24 | Disposition: A | Discharge status: Home / Self Care | Payer: Self-pay | Source: Ambulatory Visit | Attending: Family Medicine | Admitting: Family Medicine

## 2021-03-24 ENCOUNTER — Other Ambulatory Visit: Payer: Self-pay

## 2021-03-24 DIAGNOSIS — E7800 Pure hypercholesterolemia, unspecified: Secondary | ICD-10-CM | POA: Insufficient documentation

## 2021-05-10 DIAGNOSIS — F902 Attention-deficit hyperactivity disorder, combined type: Secondary | ICD-10-CM | POA: Diagnosis not present

## 2021-05-10 DIAGNOSIS — G901 Familial dysautonomia [Riley-Day]: Secondary | ICD-10-CM | POA: Diagnosis not present

## 2021-05-10 DIAGNOSIS — F952 Tourette's disorder: Secondary | ICD-10-CM | POA: Diagnosis not present

## 2021-05-10 DIAGNOSIS — F422 Mixed obsessional thoughts and acts: Secondary | ICD-10-CM | POA: Diagnosis not present

## 2021-08-16 ENCOUNTER — Other Ambulatory Visit: Payer: Self-pay | Admitting: Family Medicine

## 2021-08-16 ENCOUNTER — Ambulatory Visit
Admission: RE | Admit: 2021-08-16 | Discharge: 2021-08-16 | Disposition: A | Discharge status: Home / Self Care | Payer: BC Managed Care – PPO | Source: Ambulatory Visit | Attending: Family Medicine | Admitting: Family Medicine

## 2021-08-16 ENCOUNTER — Other Ambulatory Visit: Payer: Self-pay

## 2021-08-16 DIAGNOSIS — Z1231 Encounter for screening mammogram for malignant neoplasm of breast: Secondary | ICD-10-CM

## 2021-09-09 DIAGNOSIS — Z1211 Encounter for screening for malignant neoplasm of colon: Secondary | ICD-10-CM | POA: Diagnosis not present

## 2021-09-09 DIAGNOSIS — Z86010 Personal history of colonic polyps: Secondary | ICD-10-CM | POA: Diagnosis not present

## 2021-09-13 DIAGNOSIS — E7800 Pure hypercholesterolemia, unspecified: Secondary | ICD-10-CM | POA: Diagnosis not present

## 2021-09-13 DIAGNOSIS — F324 Major depressive disorder, single episode, in partial remission: Secondary | ICD-10-CM | POA: Diagnosis not present

## 2021-09-22 DIAGNOSIS — F952 Tourette's disorder: Secondary | ICD-10-CM | POA: Diagnosis not present

## 2021-09-22 DIAGNOSIS — F902 Attention-deficit hyperactivity disorder, combined type: Secondary | ICD-10-CM | POA: Diagnosis not present

## 2021-09-22 DIAGNOSIS — F422 Mixed obsessional thoughts and acts: Secondary | ICD-10-CM | POA: Diagnosis not present

## 2021-09-22 DIAGNOSIS — G901 Familial dysautonomia [Riley-Day]: Secondary | ICD-10-CM | POA: Diagnosis not present

## 2021-10-21 DIAGNOSIS — M79671 Pain in right foot: Secondary | ICD-10-CM | POA: Diagnosis not present

## 2021-10-21 DIAGNOSIS — M79672 Pain in left foot: Secondary | ICD-10-CM | POA: Diagnosis not present

## 2021-10-21 DIAGNOSIS — M2011 Hallux valgus (acquired), right foot: Secondary | ICD-10-CM | POA: Diagnosis not present

## 2021-10-21 DIAGNOSIS — M2012 Hallux valgus (acquired), left foot: Secondary | ICD-10-CM | POA: Diagnosis not present

## 2021-11-18 DIAGNOSIS — M79644 Pain in right finger(s): Secondary | ICD-10-CM | POA: Diagnosis not present

## 2022-02-09 DIAGNOSIS — D225 Melanocytic nevi of trunk: Secondary | ICD-10-CM | POA: Diagnosis not present

## 2022-02-09 DIAGNOSIS — L821 Other seborrheic keratosis: Secondary | ICD-10-CM | POA: Diagnosis not present

## 2022-02-09 DIAGNOSIS — L573 Poikiloderma of Civatte: Secondary | ICD-10-CM | POA: Diagnosis not present

## 2022-02-09 DIAGNOSIS — L578 Other skin changes due to chronic exposure to nonionizing radiation: Secondary | ICD-10-CM | POA: Diagnosis not present

## 2022-03-28 DIAGNOSIS — F952 Tourette's disorder: Secondary | ICD-10-CM | POA: Diagnosis not present

## 2022-03-28 DIAGNOSIS — F902 Attention-deficit hyperactivity disorder, combined type: Secondary | ICD-10-CM | POA: Diagnosis not present

## 2022-03-28 DIAGNOSIS — F319 Bipolar disorder, unspecified: Secondary | ICD-10-CM | POA: Diagnosis not present

## 2022-03-28 DIAGNOSIS — F422 Mixed obsessional thoughts and acts: Secondary | ICD-10-CM | POA: Diagnosis not present

## 2022-04-15 DIAGNOSIS — F952 Tourette's disorder: Secondary | ICD-10-CM | POA: Diagnosis not present

## 2022-04-15 DIAGNOSIS — F422 Mixed obsessional thoughts and acts: Secondary | ICD-10-CM | POA: Diagnosis not present

## 2022-04-15 DIAGNOSIS — F319 Bipolar disorder, unspecified: Secondary | ICD-10-CM | POA: Diagnosis not present

## 2022-04-15 DIAGNOSIS — F902 Attention-deficit hyperactivity disorder, combined type: Secondary | ICD-10-CM | POA: Diagnosis not present

## 2022-05-06 DIAGNOSIS — F952 Tourette's disorder: Secondary | ICD-10-CM | POA: Diagnosis not present

## 2022-05-06 DIAGNOSIS — F422 Mixed obsessional thoughts and acts: Secondary | ICD-10-CM | POA: Diagnosis not present

## 2022-05-06 DIAGNOSIS — F902 Attention-deficit hyperactivity disorder, combined type: Secondary | ICD-10-CM | POA: Diagnosis not present

## 2022-05-06 DIAGNOSIS — F319 Bipolar disorder, unspecified: Secondary | ICD-10-CM | POA: Diagnosis not present

## 2022-05-19 DIAGNOSIS — Z0000 Encounter for general adult medical examination without abnormal findings: Secondary | ICD-10-CM | POA: Diagnosis not present

## 2022-05-19 DIAGNOSIS — Z79899 Other long term (current) drug therapy: Secondary | ICD-10-CM | POA: Diagnosis not present

## 2022-05-19 DIAGNOSIS — E7800 Pure hypercholesterolemia, unspecified: Secondary | ICD-10-CM | POA: Diagnosis not present

## 2022-06-10 DIAGNOSIS — F319 Bipolar disorder, unspecified: Secondary | ICD-10-CM | POA: Diagnosis not present

## 2022-06-10 DIAGNOSIS — F422 Mixed obsessional thoughts and acts: Secondary | ICD-10-CM | POA: Diagnosis not present

## 2022-06-10 DIAGNOSIS — F952 Tourette's disorder: Secondary | ICD-10-CM | POA: Diagnosis not present

## 2022-06-10 DIAGNOSIS — F902 Attention-deficit hyperactivity disorder, combined type: Secondary | ICD-10-CM | POA: Diagnosis not present

## 2022-06-14 DIAGNOSIS — M2012 Hallux valgus (acquired), left foot: Secondary | ICD-10-CM | POA: Diagnosis not present

## 2022-06-14 DIAGNOSIS — M722 Plantar fascial fibromatosis: Secondary | ICD-10-CM | POA: Diagnosis not present

## 2022-07-06 DIAGNOSIS — Z6827 Body mass index (BMI) 27.0-27.9, adult: Secondary | ICD-10-CM | POA: Diagnosis not present

## 2022-07-06 DIAGNOSIS — Z79890 Hormone replacement therapy (postmenopausal): Secondary | ICD-10-CM | POA: Diagnosis not present

## 2022-07-13 DIAGNOSIS — F952 Tourette's disorder: Secondary | ICD-10-CM | POA: Diagnosis not present

## 2022-07-13 DIAGNOSIS — F319 Bipolar disorder, unspecified: Secondary | ICD-10-CM | POA: Diagnosis not present

## 2022-07-13 DIAGNOSIS — F902 Attention-deficit hyperactivity disorder, combined type: Secondary | ICD-10-CM | POA: Diagnosis not present

## 2022-07-13 DIAGNOSIS — F422 Mixed obsessional thoughts and acts: Secondary | ICD-10-CM | POA: Diagnosis not present

## 2022-07-26 DIAGNOSIS — E785 Hyperlipidemia, unspecified: Secondary | ICD-10-CM | POA: Diagnosis not present

## 2022-08-16 DIAGNOSIS — M25572 Pain in left ankle and joints of left foot: Secondary | ICD-10-CM | POA: Diagnosis not present

## 2022-08-16 DIAGNOSIS — M25571 Pain in right ankle and joints of right foot: Secondary | ICD-10-CM | POA: Diagnosis not present

## 2022-09-06 ENCOUNTER — Other Ambulatory Visit: Payer: Self-pay | Admitting: Family Medicine

## 2022-09-06 DIAGNOSIS — Z1231 Encounter for screening mammogram for malignant neoplasm of breast: Secondary | ICD-10-CM

## 2022-09-14 ENCOUNTER — Ambulatory Visit
Admission: RE | Admit: 2022-09-14 | Discharge: 2022-09-14 | Disposition: A | Discharge status: Home / Self Care | Payer: BC Managed Care – PPO | Source: Ambulatory Visit | Attending: Family Medicine | Admitting: Family Medicine

## 2022-09-14 DIAGNOSIS — Z1231 Encounter for screening mammogram for malignant neoplasm of breast: Secondary | ICD-10-CM | POA: Diagnosis not present

## 2022-10-10 DIAGNOSIS — Z79890 Hormone replacement therapy (postmenopausal): Secondary | ICD-10-CM | POA: Diagnosis not present

## 2022-10-10 DIAGNOSIS — R6889 Other general symptoms and signs: Secondary | ICD-10-CM | POA: Diagnosis not present

## 2022-10-10 DIAGNOSIS — Z6829 Body mass index (BMI) 29.0-29.9, adult: Secondary | ICD-10-CM | POA: Diagnosis not present

## 2022-10-21 DIAGNOSIS — F4321 Adjustment disorder with depressed mood: Secondary | ICD-10-CM | POA: Diagnosis not present

## 2022-11-10 DIAGNOSIS — R6889 Other general symptoms and signs: Secondary | ICD-10-CM | POA: Diagnosis not present

## 2022-11-10 DIAGNOSIS — Z6827 Body mass index (BMI) 27.0-27.9, adult: Secondary | ICD-10-CM | POA: Diagnosis not present

## 2022-12-14 DIAGNOSIS — F902 Attention-deficit hyperactivity disorder, combined type: Secondary | ICD-10-CM | POA: Diagnosis not present

## 2022-12-14 DIAGNOSIS — F319 Bipolar disorder, unspecified: Secondary | ICD-10-CM | POA: Diagnosis not present

## 2022-12-14 DIAGNOSIS — F422 Mixed obsessional thoughts and acts: Secondary | ICD-10-CM | POA: Diagnosis not present

## 2022-12-14 DIAGNOSIS — F952 Tourette's disorder: Secondary | ICD-10-CM | POA: Diagnosis not present

## 2023-01-24 DIAGNOSIS — F952 Tourette's disorder: Secondary | ICD-10-CM | POA: Diagnosis not present

## 2023-01-24 DIAGNOSIS — F422 Mixed obsessional thoughts and acts: Secondary | ICD-10-CM | POA: Diagnosis not present

## 2023-01-24 DIAGNOSIS — F902 Attention-deficit hyperactivity disorder, combined type: Secondary | ICD-10-CM | POA: Diagnosis not present

## 2023-01-24 DIAGNOSIS — F319 Bipolar disorder, unspecified: Secondary | ICD-10-CM | POA: Diagnosis not present

## 2023-01-30 DIAGNOSIS — H43811 Vitreous degeneration, right eye: Secondary | ICD-10-CM | POA: Diagnosis not present

## 2023-01-30 DIAGNOSIS — H04123 Dry eye syndrome of bilateral lacrimal glands: Secondary | ICD-10-CM | POA: Diagnosis not present

## 2023-01-30 DIAGNOSIS — H17822 Peripheral opacity of cornea, left eye: Secondary | ICD-10-CM | POA: Diagnosis not present

## 2023-02-03 DIAGNOSIS — F902 Attention-deficit hyperactivity disorder, combined type: Secondary | ICD-10-CM | POA: Diagnosis not present

## 2023-02-03 DIAGNOSIS — F319 Bipolar disorder, unspecified: Secondary | ICD-10-CM | POA: Diagnosis not present

## 2023-02-03 DIAGNOSIS — F952 Tourette's disorder: Secondary | ICD-10-CM | POA: Diagnosis not present

## 2023-02-03 DIAGNOSIS — F422 Mixed obsessional thoughts and acts: Secondary | ICD-10-CM | POA: Diagnosis not present

## 2023-02-13 DIAGNOSIS — R6889 Other general symptoms and signs: Secondary | ICD-10-CM | POA: Diagnosis not present

## 2023-02-13 DIAGNOSIS — Z6826 Body mass index (BMI) 26.0-26.9, adult: Secondary | ICD-10-CM | POA: Diagnosis not present

## 2023-02-14 DIAGNOSIS — D225 Melanocytic nevi of trunk: Secondary | ICD-10-CM | POA: Diagnosis not present

## 2023-02-14 DIAGNOSIS — L821 Other seborrheic keratosis: Secondary | ICD-10-CM | POA: Diagnosis not present

## 2023-02-14 DIAGNOSIS — L578 Other skin changes due to chronic exposure to nonionizing radiation: Secondary | ICD-10-CM | POA: Diagnosis not present

## 2023-06-07 DIAGNOSIS — Z23 Encounter for immunization: Secondary | ICD-10-CM | POA: Diagnosis not present

## 2023-06-07 DIAGNOSIS — E7800 Pure hypercholesterolemia, unspecified: Secondary | ICD-10-CM | POA: Diagnosis not present

## 2023-06-07 DIAGNOSIS — Z0000 Encounter for general adult medical examination without abnormal findings: Secondary | ICD-10-CM | POA: Diagnosis not present

## 2023-06-07 DIAGNOSIS — Z79899 Other long term (current) drug therapy: Secondary | ICD-10-CM | POA: Diagnosis not present

## 2023-06-15 DIAGNOSIS — F902 Attention-deficit hyperactivity disorder, combined type: Secondary | ICD-10-CM | POA: Diagnosis not present

## 2023-06-15 DIAGNOSIS — F952 Tourette's disorder: Secondary | ICD-10-CM | POA: Diagnosis not present

## 2023-06-15 DIAGNOSIS — F422 Mixed obsessional thoughts and acts: Secondary | ICD-10-CM | POA: Diagnosis not present

## 2023-06-15 DIAGNOSIS — F319 Bipolar disorder, unspecified: Secondary | ICD-10-CM | POA: Diagnosis not present

## 2023-08-10 DIAGNOSIS — Z79890 Hormone replacement therapy (postmenopausal): Secondary | ICD-10-CM | POA: Diagnosis not present

## 2023-08-10 DIAGNOSIS — R6889 Other general symptoms and signs: Secondary | ICD-10-CM | POA: Diagnosis not present

## 2023-08-16 DIAGNOSIS — E785 Hyperlipidemia, unspecified: Secondary | ICD-10-CM | POA: Diagnosis not present

## 2023-10-14 IMAGING — MG MM DIGITAL SCREENING BILAT W/ TOMO AND CAD
8 series · 8 of 24 positions shown · non-contrast
Comparison: Previous exam(s).

CLINICAL DATA: Screening.

EXAM:
DIGITAL SCREENING BILATERAL MAMMOGRAM WITH TOMOSYNTHESIS AND CAD
TECHNIQUE: Bilateral screening digital craniocaudal and mediolateral oblique
mammograms were obtained. Bilateral screening digital breast
tomosynthesis was performed. The images were evaluated with
computer-aided detection.

[L CC synth-2D]
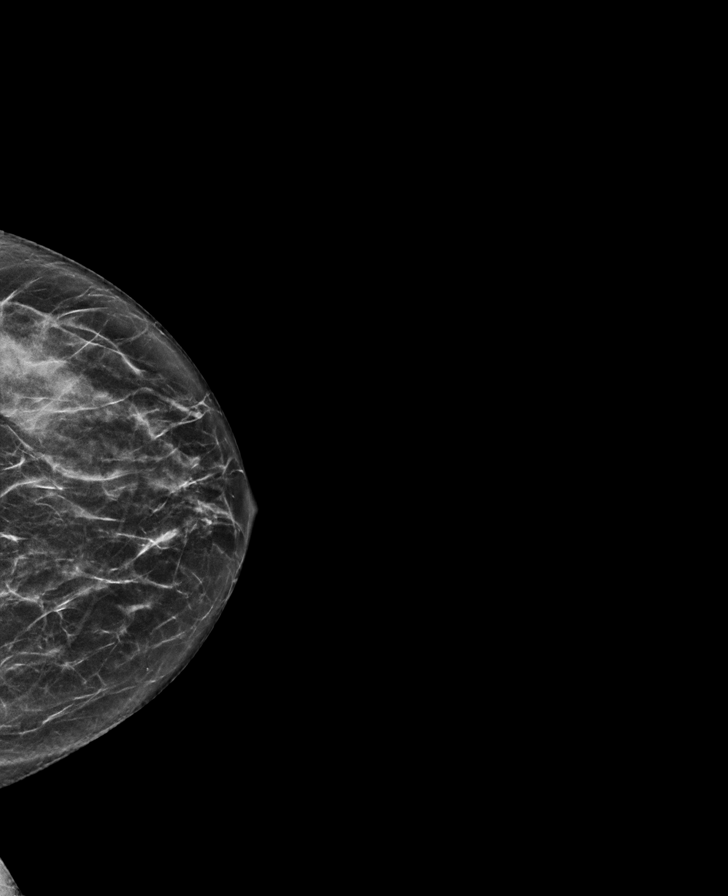

[R CC synth-2D]
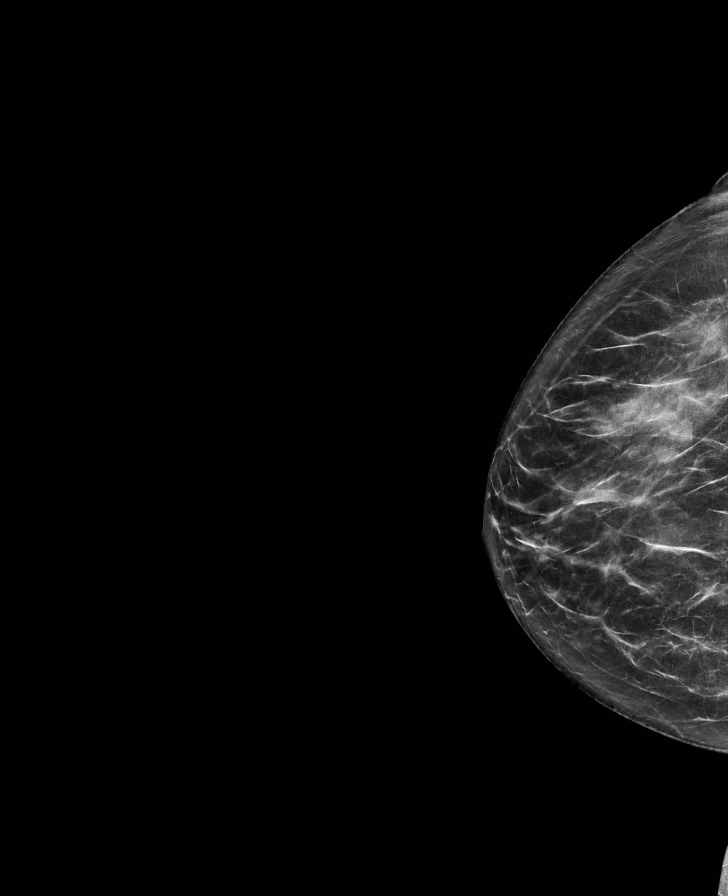

[L MLO synth-2D]
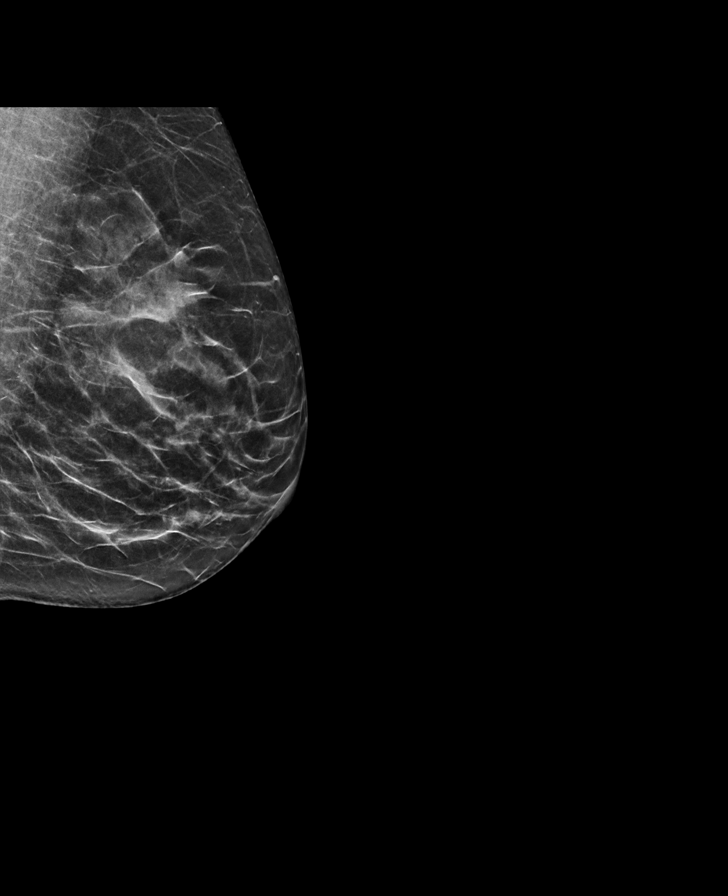

[R MLO synth-2D]
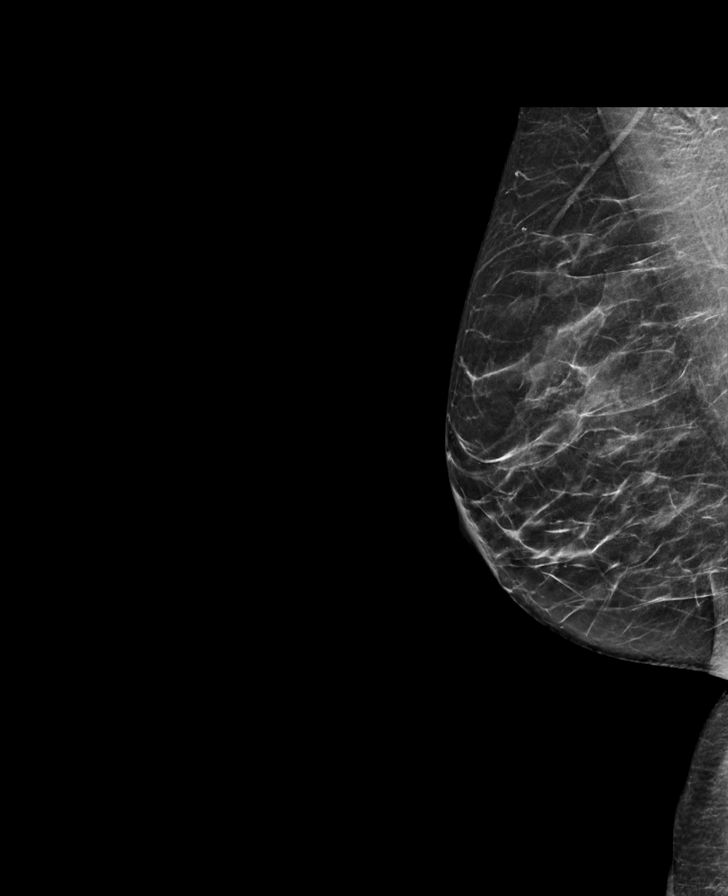

[R MLO tomo · tomo slice 31/62.0]
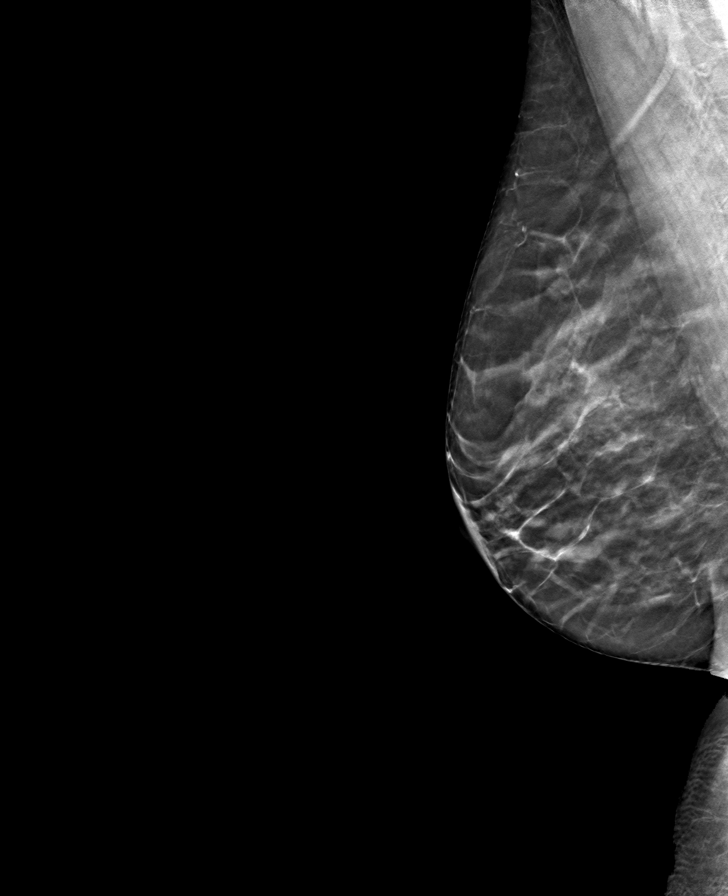

[L MLO tomo · tomo slice 29/57.0]
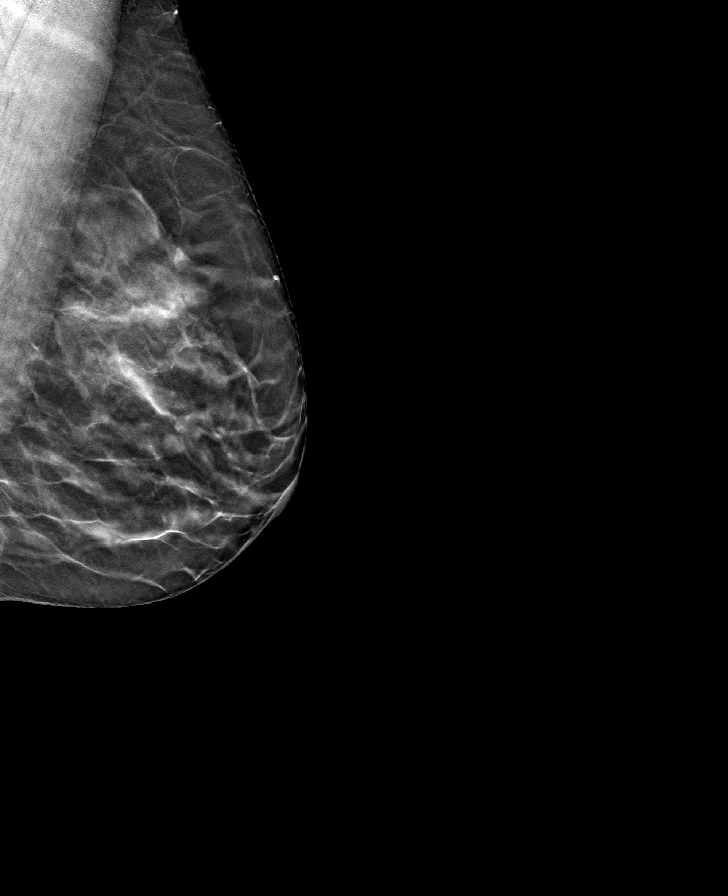

[L CC tomo · tomo slice 29/56.0]
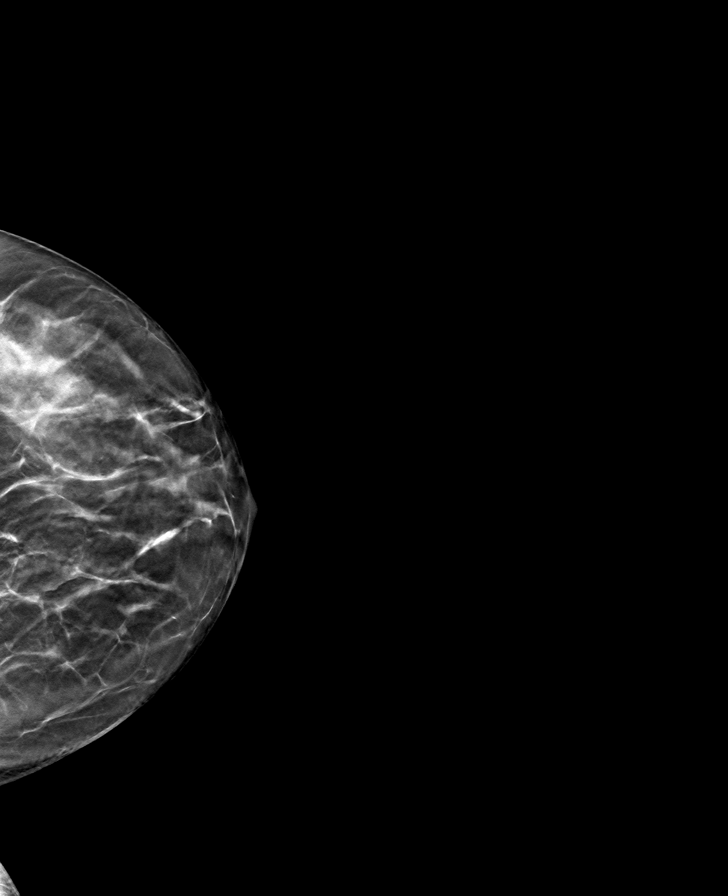

[R CC tomo · tomo slice 33/64.0]
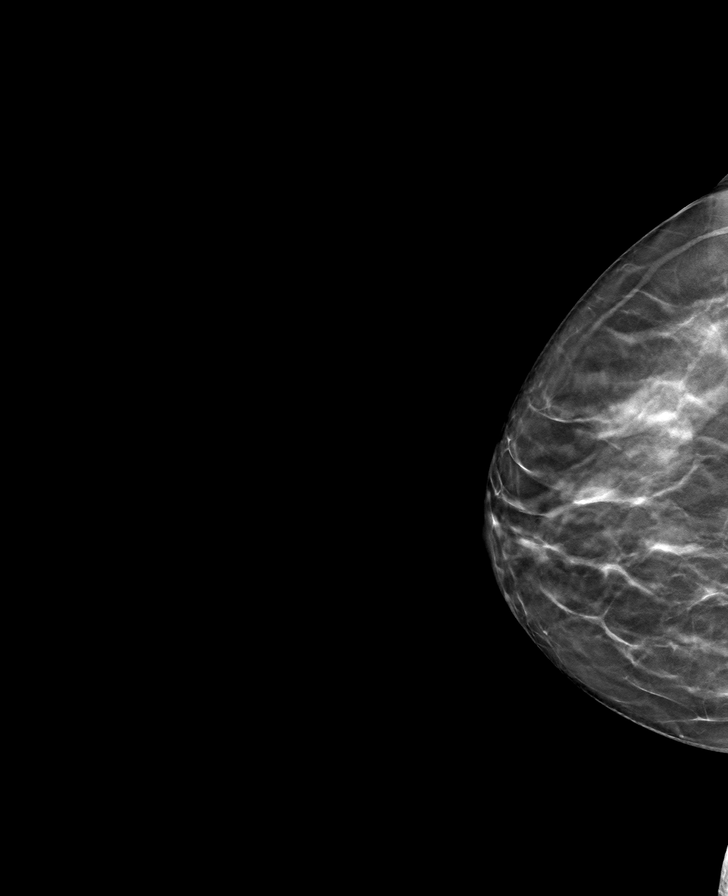

[8 of 24 positions shown; findings below may reference images not displayed]

ACR Breast Density Category c: The breast tissue is heterogeneously
dense, which may obscure small masses.
FINDINGS: There are no findings suspicious for malignancy.
IMPRESSION: No mammographic evidence of malignancy. A result letter of this
screening mammogram will be mailed directly to the patient.

RECOMMENDATION:
Screening mammogram in one year. (Code:Q3-W-BC3)

BI-RADS CATEGORY  1: Negative.

## 2023-10-16 DIAGNOSIS — F319 Bipolar disorder, unspecified: Secondary | ICD-10-CM | POA: Diagnosis not present

## 2023-10-16 DIAGNOSIS — F422 Mixed obsessional thoughts and acts: Secondary | ICD-10-CM | POA: Diagnosis not present

## 2023-10-16 DIAGNOSIS — F952 Tourette's disorder: Secondary | ICD-10-CM | POA: Diagnosis not present

## 2023-10-16 DIAGNOSIS — F902 Attention-deficit hyperactivity disorder, combined type: Secondary | ICD-10-CM | POA: Diagnosis not present

## 2023-11-15 DIAGNOSIS — F319 Bipolar disorder, unspecified: Secondary | ICD-10-CM | POA: Diagnosis not present

## 2023-11-15 DIAGNOSIS — F902 Attention-deficit hyperactivity disorder, combined type: Secondary | ICD-10-CM | POA: Diagnosis not present

## 2023-11-15 DIAGNOSIS — F422 Mixed obsessional thoughts and acts: Secondary | ICD-10-CM | POA: Diagnosis not present

## 2023-12-01 ENCOUNTER — Other Ambulatory Visit: Payer: Self-pay | Admitting: Family Medicine

## 2023-12-01 DIAGNOSIS — Z1231 Encounter for screening mammogram for malignant neoplasm of breast: Secondary | ICD-10-CM

## 2023-12-07 ENCOUNTER — Ambulatory Visit
Admission: RE | Admit: 2023-12-07 | Discharge: 2023-12-07 | Disposition: A | Discharge status: Home / Self Care | Payer: BC Managed Care – PPO | Source: Ambulatory Visit | Attending: Family Medicine | Admitting: Family Medicine

## 2023-12-07 DIAGNOSIS — Z1231 Encounter for screening mammogram for malignant neoplasm of breast: Secondary | ICD-10-CM

## 2023-12-20 DIAGNOSIS — F319 Bipolar disorder, unspecified: Secondary | ICD-10-CM | POA: Diagnosis not present

## 2024-01-23 DIAGNOSIS — L821 Other seborrheic keratosis: Secondary | ICD-10-CM | POA: Diagnosis not present

## 2024-01-23 DIAGNOSIS — Z85828 Personal history of other malignant neoplasm of skin: Secondary | ICD-10-CM | POA: Diagnosis not present

## 2024-01-23 DIAGNOSIS — L578 Other skin changes due to chronic exposure to nonionizing radiation: Secondary | ICD-10-CM | POA: Diagnosis not present

## 2024-01-23 DIAGNOSIS — L570 Actinic keratosis: Secondary | ICD-10-CM | POA: Diagnosis not present

## 2024-01-23 DIAGNOSIS — D225 Melanocytic nevi of trunk: Secondary | ICD-10-CM | POA: Diagnosis not present

## 2024-02-29 DIAGNOSIS — F902 Attention-deficit hyperactivity disorder, combined type: Secondary | ICD-10-CM | POA: Diagnosis not present

## 2024-02-29 DIAGNOSIS — F422 Mixed obsessional thoughts and acts: Secondary | ICD-10-CM | POA: Diagnosis not present

## 2024-02-29 DIAGNOSIS — F319 Bipolar disorder, unspecified: Secondary | ICD-10-CM | POA: Diagnosis not present

## 2024-02-29 DIAGNOSIS — F952 Tourette's disorder: Secondary | ICD-10-CM | POA: Diagnosis not present

## 2024-03-25 DIAGNOSIS — H04123 Dry eye syndrome of bilateral lacrimal glands: Secondary | ICD-10-CM | POA: Diagnosis not present

## 2024-03-25 DIAGNOSIS — H43811 Vitreous degeneration, right eye: Secondary | ICD-10-CM | POA: Diagnosis not present

## 2024-03-25 DIAGNOSIS — H17822 Peripheral opacity of cornea, left eye: Secondary | ICD-10-CM | POA: Diagnosis not present

## 2024-06-19 DIAGNOSIS — N959 Unspecified menopausal and perimenopausal disorder: Secondary | ICD-10-CM | POA: Diagnosis not present

## 2024-06-19 DIAGNOSIS — E7800 Pure hypercholesterolemia, unspecified: Secondary | ICD-10-CM | POA: Diagnosis not present

## 2024-06-19 DIAGNOSIS — R7301 Impaired fasting glucose: Secondary | ICD-10-CM | POA: Diagnosis not present

## 2024-06-19 DIAGNOSIS — Z79899 Other long term (current) drug therapy: Secondary | ICD-10-CM | POA: Diagnosis not present

## 2024-06-19 DIAGNOSIS — Z0000 Encounter for general adult medical examination without abnormal findings: Secondary | ICD-10-CM | POA: Diagnosis not present

## 2024-06-19 DIAGNOSIS — E785 Hyperlipidemia, unspecified: Secondary | ICD-10-CM | POA: Diagnosis not present

## 2024-07-22 DIAGNOSIS — F902 Attention-deficit hyperactivity disorder, combined type: Secondary | ICD-10-CM | POA: Diagnosis not present

## 2024-07-22 DIAGNOSIS — F319 Bipolar disorder, unspecified: Secondary | ICD-10-CM | POA: Diagnosis not present
